# Patient Record
Sex: Female | Born: 1955 | Race: White | Hispanic: Yes | Marital: Married | Smoking: Never smoker
Health system: Southern US, Community
[De-identification: ages and names within clinical notes are randomized; demographics above are authoritative.]

## PROBLEM LIST (undated history)

## (undated) DIAGNOSIS — F419 Anxiety disorder, unspecified: Secondary | ICD-10-CM

## (undated) DIAGNOSIS — E039 Hypothyroidism, unspecified: Secondary | ICD-10-CM

## (undated) DIAGNOSIS — J449 Chronic obstructive pulmonary disease, unspecified: Secondary | ICD-10-CM

## (undated) DIAGNOSIS — K5190 Ulcerative colitis, unspecified, without complications: Secondary | ICD-10-CM

## (undated) DIAGNOSIS — J45909 Unspecified asthma, uncomplicated: Secondary | ICD-10-CM

## (undated) DIAGNOSIS — K219 Gastro-esophageal reflux disease without esophagitis: Secondary | ICD-10-CM

## (undated) DIAGNOSIS — M810 Age-related osteoporosis without current pathological fracture: Secondary | ICD-10-CM

## (undated) DIAGNOSIS — R112 Nausea with vomiting, unspecified: Secondary | ICD-10-CM

## (undated) DIAGNOSIS — M8580 Other specified disorders of bone density and structure, unspecified site: Secondary | ICD-10-CM

## (undated) DIAGNOSIS — Z9889 Other specified postprocedural states: Secondary | ICD-10-CM

## (undated) HISTORY — PX: HERNIA REPAIR: SHX51

## (undated) HISTORY — PX: ESOPHAGOGASTRODUODENOSCOPY: SHX1529

## (undated) HISTORY — PX: COLONOSCOPY: SHX174

---

## 2004-06-09 ENCOUNTER — Ambulatory Visit: Payer: Self-pay | Admitting: Unknown Physician Specialty

## 2004-08-08 ENCOUNTER — Ambulatory Visit: Payer: Self-pay | Admitting: Unknown Physician Specialty

## 2004-08-09 ENCOUNTER — Ambulatory Visit: Payer: Self-pay | Admitting: Internal Medicine

## 2004-10-18 ENCOUNTER — Ambulatory Visit: Payer: Self-pay | Admitting: Internal Medicine

## 2005-09-17 ENCOUNTER — Ambulatory Visit: Payer: Self-pay | Admitting: Internal Medicine

## 2006-01-07 ENCOUNTER — Ambulatory Visit: Payer: Self-pay | Admitting: Unknown Physician Specialty

## 2006-07-09 ENCOUNTER — Ambulatory Visit: Payer: Self-pay | Admitting: Unknown Physician Specialty

## 2006-07-25 ENCOUNTER — Ambulatory Visit: Payer: Self-pay | Admitting: Internal Medicine

## 2006-08-01 ENCOUNTER — Ambulatory Visit: Payer: Self-pay | Admitting: Internal Medicine

## 2006-08-06 ENCOUNTER — Ambulatory Visit: Payer: Self-pay | Admitting: Internal Medicine

## 2006-10-16 ENCOUNTER — Ambulatory Visit: Payer: Self-pay | Admitting: Internal Medicine

## 2007-07-28 ENCOUNTER — Ambulatory Visit: Payer: Self-pay | Admitting: Unknown Physician Specialty

## 2007-10-09 ENCOUNTER — Other Ambulatory Visit: Payer: Self-pay

## 2007-10-09 ENCOUNTER — Emergency Department: Payer: Self-pay | Admitting: Emergency Medicine

## 2007-10-21 ENCOUNTER — Ambulatory Visit: Payer: Self-pay | Admitting: Internal Medicine

## 2007-11-03 ENCOUNTER — Ambulatory Visit: Payer: Self-pay | Admitting: Internal Medicine

## 2007-12-01 ENCOUNTER — Ambulatory Visit: Payer: Self-pay | Admitting: Internal Medicine

## 2007-12-15 ENCOUNTER — Ambulatory Visit: Payer: Self-pay | Admitting: Internal Medicine

## 2008-01-26 ENCOUNTER — Ambulatory Visit: Payer: Self-pay | Admitting: Internal Medicine

## 2008-04-20 ENCOUNTER — Ambulatory Visit: Payer: Self-pay | Admitting: Internal Medicine

## 2008-10-31 ENCOUNTER — Ambulatory Visit: Payer: Self-pay | Admitting: Gastroenterology

## 2008-11-07 ENCOUNTER — Ambulatory Visit: Payer: Self-pay | Admitting: Internal Medicine

## 2008-11-14 ENCOUNTER — Ambulatory Visit: Payer: Self-pay | Admitting: Internal Medicine

## 2008-12-08 ENCOUNTER — Ambulatory Visit: Payer: Self-pay | Admitting: Internal Medicine

## 2009-04-11 ENCOUNTER — Encounter: Payer: Self-pay | Admitting: Internal Medicine

## 2009-05-10 ENCOUNTER — Ambulatory Visit: Payer: Self-pay | Admitting: Gastroenterology

## 2009-07-17 ENCOUNTER — Ambulatory Visit: Payer: Self-pay | Admitting: Gastroenterology

## 2009-12-14 ENCOUNTER — Ambulatory Visit: Payer: Self-pay | Admitting: Internal Medicine

## 2009-12-19 ENCOUNTER — Ambulatory Visit: Payer: Self-pay | Admitting: Internal Medicine

## 2010-09-18 ENCOUNTER — Emergency Department: Payer: Self-pay | Admitting: Emergency Medicine

## 2010-10-10 ENCOUNTER — Other Ambulatory Visit: Payer: Self-pay | Admitting: Physician Assistant

## 2010-10-31 ENCOUNTER — Ambulatory Visit: Payer: Self-pay | Admitting: Gastroenterology

## 2010-12-20 ENCOUNTER — Ambulatory Visit: Payer: Self-pay | Admitting: Internal Medicine

## 2011-01-24 ENCOUNTER — Ambulatory Visit: Payer: Self-pay | Admitting: Internal Medicine

## 2011-03-14 ENCOUNTER — Ambulatory Visit: Payer: Self-pay | Admitting: Internal Medicine

## 2011-04-18 ENCOUNTER — Ambulatory Visit: Payer: Self-pay | Admitting: Internal Medicine

## 2011-05-03 ENCOUNTER — Ambulatory Visit: Payer: Self-pay | Admitting: Rheumatology

## 2011-05-20 ENCOUNTER — Other Ambulatory Visit: Payer: Self-pay | Admitting: *Deleted

## 2011-05-20 MED ORDER — BUDESONIDE-FORMOTEROL FUMARATE 160-4.5 MCG/ACT IN AERO: 2.0000 | INHALATION_SPRAY | Freq: Two times a day (BID) | RESPIRATORY_TRACT | Status: AC

## 2011-05-20 NOTE — Telephone Encounter (Signed)
Received faxed refill request from pharmacy for Symbicort. Is it okay to refill medication?

## 2011-05-21 ENCOUNTER — Other Ambulatory Visit: Payer: Self-pay | Admitting: *Deleted

## 2011-05-21 NOTE — Telephone Encounter (Signed)
Opened in error, refill already requested.

## 2011-07-11 ENCOUNTER — Ambulatory Visit: Payer: Self-pay | Admitting: Pain Medicine

## 2011-07-24 ENCOUNTER — Ambulatory Visit: Payer: Self-pay | Admitting: Pain Medicine

## 2011-08-05 ENCOUNTER — Other Ambulatory Visit: Payer: Self-pay | Admitting: Internal Medicine

## 2011-08-05 MED ORDER — ERYTHROMYCIN 5 MG/GM OP OINT: TOPICAL_OINTMENT | OPHTHALMIC | Status: DC

## 2011-08-05 NOTE — Telephone Encounter (Signed)
Ok to refill erythromycin ointment.

## 2011-08-22 ENCOUNTER — Ambulatory Visit: Payer: Self-pay | Admitting: Pain Medicine

## 2011-09-05 ENCOUNTER — Other Ambulatory Visit: Payer: Self-pay | Admitting: Internal Medicine

## 2011-09-06 NOTE — Telephone Encounter (Signed)
Please advise if ok for RF 

## 2011-09-06 NOTE — Telephone Encounter (Signed)
She has not been seen in this clinicic, and is probably seeing another doc locally since she is Medicaid patient so I cannot refill more than #4 pills (one month)  without having her reestablish care.

## 2011-09-18 ENCOUNTER — Other Ambulatory Visit: Payer: Self-pay | Admitting: *Deleted

## 2011-09-18 MED ORDER — ERYTHROMYCIN 5 MG/GM OP OINT: TOPICAL_OINTMENT | OPHTHALMIC | Status: DC

## 2011-09-18 MED ORDER — LEVOTHYROXINE SODIUM 50 MCG PO TABS: 50.0000 ug | ORAL_TABLET | Freq: Every day | ORAL | Status: AC

## 2011-09-18 MED ORDER — ALBUTEROL 90 MCG/ACT IN AERS: INHALATION_SPRAY | RESPIRATORY_TRACT | Status: AC

## 2011-09-18 NOTE — Telephone Encounter (Signed)
Faxed refill request from [facility], pt has not been seen in this office and doesn't have any appts scheduled.

## 2012-01-21 ENCOUNTER — Ambulatory Visit: Payer: Self-pay | Admitting: Internal Medicine

## 2012-06-01 ENCOUNTER — Telehealth: Payer: Self-pay | Admitting: Internal Medicine

## 2012-06-01 NOTE — Telephone Encounter (Signed)
2nd refill request

## 2012-06-01 NOTE — Telephone Encounter (Signed)
symbicort 160-4.5 mcg INH AER GM Inhale two puffs into lungs twice a day

## 2012-11-17 ENCOUNTER — Ambulatory Visit: Payer: Self-pay | Admitting: Emergency Medicine

## 2013-02-03 ENCOUNTER — Ambulatory Visit: Payer: Self-pay | Admitting: Internal Medicine

## 2013-11-25 ENCOUNTER — Ambulatory Visit: Payer: Self-pay | Admitting: Nurse Practitioner

## 2014-12-29 ENCOUNTER — Ambulatory Visit
Admit: 2014-12-29 | Disposition: A | Discharge status: Home / Self Care | Payer: Self-pay | Attending: Gastroenterology | Admitting: Gastroenterology

## 2014-12-30 NOTE — Op Note (Signed)
PATIENT NAME:  Sherry Schaefer, Sherry M MR#:  [id number] DATE OF BIRTH:  1955/09/16  DATE OF PROCEDURE:  11/17/2012   PREOPERATIVE DIAGNOSES:  1. Incarcerated umbilical hernia.  2. Excision of lipoma of the abdominal wall.   POSTOPERATIVE DIAGNOSES:  1. Incarcerated umbilical hernia.  2. Excision of lipoma of the abdominal wall.   OPERATION:  1. Repair of incarcerated umbilical hernia.  2. Excision of lipoma of the abdominal wall.   DESCRIPTION OF PROCEDURE: This patient was seen by me in the office because of the incarcerated umbilical hernia and also was seen because of the lipoma of the abdominal wall on the left side, and the patient was here to remove that lipoma as well as repair of hernia. She was having discomfort in the hernia area. She is a very thin and slim lady. She also has a history of ulcerative colitis. The umbilical skin was very thinned out. A small incision was made infraumbilically. After cutting the skin and subcutaneous tissue, the hernia sac was dissected all around. The hernia sac was then removed from the umbilical area, and there was a small lipoma stuck into it. It was dissected off, and rent in the abdominal wall was small. The omentum was then tied and pushed back into the abdomen. The wound was then closed with interrupted 0 Vicryl sutures. Two were applied. Subcuticular closing was performed with 3-0 Vicryl from the umbilicus as well as the subcutaneous tissue, and then the skin was closed with 6-0 nylon interrupted stitches.   The patient has a large lipoma of the left abdominal wall. About a 5 cm into 5 cm incision was made on top of it. The lipoma was then dissected off from the intramuscular and intercostal space and was completely excised. The wound was then closed in layers and then with 4-0 nylon interrupted stitches. The patient tolerated the procedure well and was sent to the recovery room in satisfactory condition.    ____________________________ Alton RevereMasud S.  Cecelia ByarsHashmi, MD msh:lo D: 11/17/2012 10:08:06 ET T: 11/17/2012 11:52:29 ET JOB#: [id number]  cc: Masud S. Cecelia ByarsHashmi, MD, <Dictator> Maureen P. Alison MurrayAndreassi, MD  Meryle ReadyMASUD S HASHMI MD ELECTRONICALLY SIGNED 11/19/2012 12:36

## 2015-01-02 LAB — SURGICAL PATHOLOGY

## 2015-01-04 ENCOUNTER — Other Ambulatory Visit: Payer: Self-pay

## 2015-01-04 DIAGNOSIS — Z1231 Encounter for screening mammogram for malignant neoplasm of breast: Secondary | ICD-10-CM

## 2015-01-20 ENCOUNTER — Ambulatory Visit
Admission: RE | Admit: 2015-01-20 | Discharge: 2015-01-20 | Disposition: A | Discharge status: Home / Self Care | Payer: BLUE CROSS/BLUE SHIELD | Source: Ambulatory Visit | Attending: Internal Medicine | Admitting: Internal Medicine

## 2015-01-20 DIAGNOSIS — Z1231 Encounter for screening mammogram for malignant neoplasm of breast: Secondary | ICD-10-CM

## 2015-08-24 ENCOUNTER — Other Ambulatory Visit: Payer: Self-pay | Admitting: Internal Medicine

## 2015-08-24 DIAGNOSIS — R1310 Dysphagia, unspecified: Secondary | ICD-10-CM

## 2015-08-24 DIAGNOSIS — K219 Gastro-esophageal reflux disease without esophagitis: Principal | ICD-10-CM

## 2015-08-24 DIAGNOSIS — IMO0001 Reserved for inherently not codable concepts without codable children: Secondary | ICD-10-CM

## 2015-09-13 ENCOUNTER — Ambulatory Visit: Payer: BLUE CROSS/BLUE SHIELD

## 2015-09-13 ENCOUNTER — Ambulatory Visit
Admission: RE | Admit: 2015-09-13 | Discharge: 2015-09-13 | Disposition: A | Discharge status: Home / Self Care | Payer: BLUE CROSS/BLUE SHIELD | Source: Ambulatory Visit | Attending: Internal Medicine | Admitting: Internal Medicine

## 2015-09-13 DIAGNOSIS — K219 Gastro-esophageal reflux disease without esophagitis: Secondary | ICD-10-CM | POA: Insufficient documentation

## 2015-09-13 DIAGNOSIS — R1310 Dysphagia, unspecified: Secondary | ICD-10-CM

## 2015-09-13 DIAGNOSIS — IMO0001 Reserved for inherently not codable concepts without codable children: Secondary | ICD-10-CM

## 2015-10-16 ENCOUNTER — Encounter: Payer: Self-pay | Admitting: Emergency Medicine

## 2015-10-16 ENCOUNTER — Emergency Department: Payer: BLUE CROSS/BLUE SHIELD

## 2015-10-16 ENCOUNTER — Emergency Department
Admission: EM | Admit: 2015-10-16 | Discharge: 2015-10-16 | Disposition: A | Discharge status: Home / Self Care | Payer: BLUE CROSS/BLUE SHIELD | Attending: Emergency Medicine | Admitting: Emergency Medicine

## 2015-10-16 DIAGNOSIS — Z79899 Other long term (current) drug therapy: Secondary | ICD-10-CM | POA: Diagnosis not present

## 2015-10-16 DIAGNOSIS — F419 Anxiety disorder, unspecified: Secondary | ICD-10-CM | POA: Insufficient documentation

## 2015-10-16 DIAGNOSIS — Z7951 Long term (current) use of inhaled steroids: Secondary | ICD-10-CM | POA: Insufficient documentation

## 2015-10-16 DIAGNOSIS — S199XXA Unspecified injury of neck, initial encounter: Secondary | ICD-10-CM | POA: Diagnosis present

## 2015-10-16 DIAGNOSIS — Y998 Other external cause status: Secondary | ICD-10-CM | POA: Diagnosis not present

## 2015-10-16 DIAGNOSIS — Y9389 Activity, other specified: Secondary | ICD-10-CM | POA: Diagnosis not present

## 2015-10-16 DIAGNOSIS — S134XXA Sprain of ligaments of cervical spine, initial encounter: Secondary | ICD-10-CM | POA: Diagnosis not present

## 2015-10-16 DIAGNOSIS — S20219A Contusion of unspecified front wall of thorax, initial encounter: Secondary | ICD-10-CM

## 2015-10-16 DIAGNOSIS — Y92410 Unspecified street and highway as the place of occurrence of the external cause: Secondary | ICD-10-CM | POA: Insufficient documentation

## 2015-10-16 DIAGNOSIS — Z792 Long term (current) use of antibiotics: Secondary | ICD-10-CM | POA: Insufficient documentation

## 2015-10-16 DIAGNOSIS — S139XXA Sprain of joints and ligaments of unspecified parts of neck, initial encounter: Secondary | ICD-10-CM

## 2015-10-16 HISTORY — DX: Unspecified asthma, uncomplicated: J45.909

## 2015-10-16 LAB — COMPREHENSIVE METABOLIC PANEL
ALK PHOS: 84 U/L (ref 38–126)
ALT: 20 U/L (ref 14–54)
AST: 24 U/L (ref 15–41)
Albumin: 4.3 g/dL (ref 3.5–5.0)
Anion gap: 6 (ref 5–15)
BUN: 8 mg/dL (ref 6–20)
CALCIUM: 9.2 mg/dL (ref 8.9–10.3)
CO2: 28 mmol/L (ref 22–32)
CREATININE: 0.49 mg/dL (ref 0.44–1.00)
Chloride: 105 mmol/L (ref 101–111)
Glucose, Bld: 99 mg/dL (ref 65–99)
Potassium: 3.7 mmol/L (ref 3.5–5.1)
Sodium: 139 mmol/L (ref 135–145)
Total Bilirubin: <0.1 mg/dL — ABNORMAL LOW (ref 0.3–1.2)
Total Protein: 7.8 g/dL (ref 6.5–8.1)

## 2015-10-16 LAB — CBC
HCT: 39.3 % (ref 35.0–47.0)
Hemoglobin: 12.8 g/dL (ref 12.0–16.0)
MCH: 28.6 pg (ref 26.0–34.0)
MCHC: 32.5 g/dL (ref 32.0–36.0)
MCV: 87.9 fL (ref 80.0–100.0)
Platelets: 157 10*3/uL (ref 150–440)
RBC: 4.47 MIL/uL (ref 3.80–5.20)
RDW: 14 % (ref 11.5–14.5)
WBC: 10.7 10*3/uL (ref 3.6–11.0)

## 2015-10-16 LAB — TROPONIN I: Troponin I: <0.03 ng/mL (ref <0.031)

## 2015-10-16 MED ORDER — IOHEXOL 300 MG/ML  SOLN
75.0000 mL | Freq: Once | INTRAMUSCULAR | Status: AC | PRN
  Administered 2015-10-16: 75 mL via INTRAVENOUS
  Filled 2015-10-16: qty 75

## 2015-10-16 MED ORDER — NAPROXEN 500 MG PO TABS: 500.0000 mg | ORAL_TABLET | Freq: Two times a day (BID) | ORAL | Status: DC
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
## 2015-10-16 NOTE — ED Notes (Signed)
c-collar applied  
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
## 2016-01-08 ENCOUNTER — Encounter: Payer: Self-pay | Admitting: *Deleted

## 2016-01-11 ENCOUNTER — Other Ambulatory Visit: Payer: Self-pay | Admitting: Nurse Practitioner

## 2016-01-11 DIAGNOSIS — Z1231 Encounter for screening mammogram for malignant neoplasm of breast: Secondary | ICD-10-CM

## 2016-01-15 NOTE — Discharge Instructions (Signed)

## 2016-01-16 ENCOUNTER — Ambulatory Visit: Payer: BLUE CROSS/BLUE SHIELD | Admitting: Anesthesiology

## 2016-01-16 ENCOUNTER — Ambulatory Visit
Admission: RE | Admit: 2016-01-16 | Discharge: 2016-01-16 | Disposition: A | Discharge status: Home / Self Care | Payer: BLUE CROSS/BLUE SHIELD | Source: Ambulatory Visit | Attending: Gastroenterology | Admitting: Gastroenterology

## 2016-01-16 ENCOUNTER — Encounter
Admission: RE | Disposition: A | Discharge status: Home / Self Care | Payer: Self-pay | Source: Ambulatory Visit | Attending: Gastroenterology

## 2016-01-16 DIAGNOSIS — K922 Gastrointestinal hemorrhage, unspecified: Secondary | ICD-10-CM | POA: Insufficient documentation

## 2016-01-16 DIAGNOSIS — E559 Vitamin D deficiency, unspecified: Secondary | ICD-10-CM | POA: Diagnosis not present

## 2016-01-16 DIAGNOSIS — F419 Anxiety disorder, unspecified: Secondary | ICD-10-CM | POA: Diagnosis not present

## 2016-01-16 DIAGNOSIS — K529 Noninfective gastroenteritis and colitis, unspecified: Secondary | ICD-10-CM | POA: Insufficient documentation

## 2016-01-16 DIAGNOSIS — E039 Hypothyroidism, unspecified: Secondary | ICD-10-CM | POA: Diagnosis not present

## 2016-01-16 DIAGNOSIS — E079 Disorder of thyroid, unspecified: Secondary | ICD-10-CM | POA: Diagnosis not present

## 2016-01-16 DIAGNOSIS — K219 Gastro-esophageal reflux disease without esophagitis: Secondary | ICD-10-CM | POA: Insufficient documentation

## 2016-01-16 DIAGNOSIS — K51919 Ulcerative colitis, unspecified with unspecified complications: Secondary | ICD-10-CM | POA: Diagnosis present

## 2016-01-16 DIAGNOSIS — Z79899 Other long term (current) drug therapy: Secondary | ICD-10-CM | POA: Diagnosis not present

## 2016-01-16 DIAGNOSIS — Z8719 Personal history of other diseases of the digestive system: Secondary | ICD-10-CM | POA: Diagnosis not present

## 2016-01-16 DIAGNOSIS — Z800 Family history of malignant neoplasm of digestive organs: Secondary | ICD-10-CM | POA: Diagnosis not present

## 2016-01-16 DIAGNOSIS — K6289 Other specified diseases of anus and rectum: Secondary | ICD-10-CM | POA: Diagnosis not present

## 2016-01-16 DIAGNOSIS — M810 Age-related osteoporosis without current pathological fracture: Secondary | ICD-10-CM | POA: Insufficient documentation

## 2016-01-16 HISTORY — DX: Ulcerative colitis, unspecified, without complications: K51.90

## 2016-01-16 HISTORY — PX: COLONOSCOPY WITH PROPOFOL: SHX5780

## 2016-01-16 HISTORY — DX: Chronic obstructive pulmonary disease, unspecified: J44.9

## 2016-01-16 HISTORY — DX: Hypothyroidism, unspecified: E03.9

## 2016-01-16 HISTORY — DX: Other specified disorders of bone density and structure, unspecified site: M85.80

## 2016-01-16 HISTORY — DX: Other specified postprocedural states: Z98.890

## 2016-01-16 HISTORY — DX: Anxiety disorder, unspecified: F41.9

## 2016-01-16 HISTORY — DX: Age-related osteoporosis without current pathological fracture: M81.0

## 2016-01-16 HISTORY — DX: Nausea with vomiting, unspecified: R11.2

## 2016-01-16 HISTORY — DX: Gastro-esophageal reflux disease without esophagitis: K21.9

## 2016-01-16 SURGERY — COLONOSCOPY WITH PROPOFOL
Anesthesia: Monitor Anesthesia Care | Wound class: Contaminated

## 2016-01-16 MED ORDER — PROPOFOL 10 MG/ML IV BOLUS
INTRAVENOUS | Status: DC | PRN
  Administered 2016-01-16 (×4): 50 mg via INTRAVENOUS

## 2016-01-16 MED ORDER — STERILE WATER FOR IRRIGATION IR SOLN
Status: DC | PRN
  Administered 2016-01-16: 11:00:00

## 2016-01-16 MED ORDER — ACETAMINOPHEN 325 MG PO TABS: 325.0000 mg | ORAL_TABLET | ORAL | Status: DC | PRN

## 2016-01-16 MED ORDER — ACETAMINOPHEN 160 MG/5ML PO SOLN: 325.0000 mg | ORAL | Status: DC | PRN

## 2016-01-16 MED ORDER — LIDOCAINE HCL (CARDIAC) 20 MG/ML IV SOLN
INTRAVENOUS | Status: DC | PRN
  Administered 2016-01-16: 30 mg via INTRAVENOUS

## 2016-01-16 MED ORDER — ONDANSETRON HCL 4 MG/2ML IJ SOLN: 4.0000 mg | Freq: Once | INTRAMUSCULAR | Status: DC | PRN

## 2016-01-16 MED ORDER — LACTATED RINGERS IV SOLN
INTRAVENOUS | Status: DC
  Administered 2016-01-16: 10:00:00 via INTRAVENOUS

## 2016-01-16 MED ORDER — SODIUM CHLORIDE 0.9 % IV SOLN: INTRAVENOUS | Status: DC

## 2016-01-16 SURGICAL SUPPLY — 30 items
CANISTER SUCT 1200ML W/VALVE (MISCELLANEOUS) ×3 IMPLANT
FCP ESCP3.2XJMB 240X2.8X (MISCELLANEOUS)
FORCEPS BIOP RAD 4 LRG CAP 4 (CUTTING FORCEPS) ×3 IMPLANT
FORCEPS BIOP RJ4 240 W/NDL (MISCELLANEOUS)
FORCEPS ESCP3.2XJMB 240X2.8X (MISCELLANEOUS) IMPLANT
GOWN CVR UNV OPN BCK APRN NK (MISCELLANEOUS) ×1 IMPLANT
GOWN ISOL THUMB LOOP REG UNIV (MISCELLANEOUS) ×2
GOWN STRL REUS W/ TWL LRG LVL3 (GOWN DISPOSABLE) ×1 IMPLANT
GOWN STRL REUS W/TWL LRG LVL3 (GOWN DISPOSABLE) ×2
HEMOCLIP INSTINCT (CLIP) IMPLANT
INJECTOR VARIJECT VIN23 (MISCELLANEOUS) IMPLANT
KIT CO2 TUBING (TUBING) IMPLANT
KIT DEFENDO VALVE AND CONN (KITS) IMPLANT
KIT ENDO PROCEDURE OLY (KITS) ×3 IMPLANT
LIGATOR MULTIBAND 6SHOOTER MBL (MISCELLANEOUS) IMPLANT
MARKER SPOT ENDO TATTOO 5ML (MISCELLANEOUS) IMPLANT
PAD GROUND ADULT SPLIT (MISCELLANEOUS) IMPLANT
SNARE SHORT THROW 13M SML OVAL (MISCELLANEOUS) IMPLANT
SNARE SHORT THROW 30M LRG OVAL (MISCELLANEOUS) IMPLANT
SPOT EX ENDOSCOPIC TATTOO (MISCELLANEOUS)
SUCTION POLY TRAP 4CHAMBER (MISCELLANEOUS) IMPLANT
TRAP SUCTION POLY (MISCELLANEOUS) IMPLANT
TUBING CONN 6MMX3.1M (TUBING)
TUBING SUCTION CONN 0.25 STRL (TUBING) IMPLANT
UNDERPAD 30X60 958B10 (PK) (MISCELLANEOUS) IMPLANT
VALVE BIOPSY ENDO (VALVE) IMPLANT
VARIJECT INJECTOR VIN23 (MISCELLANEOUS)
WATER AUXILLARY (MISCELLANEOUS) IMPLANT
WATER STERILE IRR 250ML POUR (IV SOLUTION) IMPLANT
WATER STERILE IRR 500ML POUR (IV SOLUTION) IMPLANT

## 2016-01-16 NOTE — H&P (Signed)
  Date of Initial H&P: 01/03/2016 History reviewed, patient examined, no change in status, stable for surgery. 

## 2016-01-16 NOTE — Transfer of Care (Signed)
Immediate Anesthesia Transfer of Care Note  Patient: Sherry Schaefer  Procedure(s) Performed: Procedure(s) with comments: COLONOSCOPY WITH PROPOFOL (N/A) - pt requests early  Patient Location: PACU  Anesthesia Type: MAC  Level of Consciousness: awake, alert  and patient cooperative  Airway and Oxygen Therapy: Patient Spontanous Breathing and Patient connected to supplemental oxygen  Post-op Assessment: Post-op Vital signs reviewed, Patient's Cardiovascular Status Stable, Respiratory Function Stable, Patent Airway and No signs of Nausea or vomiting  Post-op Vital Signs: Reviewed and stable  Complications: No apparent anesthesia complications

## 2016-01-16 NOTE — Anesthesia Postprocedure Evaluation (Signed)
Anesthesia Post Note  Patient: [name]  Procedure(s) Performed: Procedure(s) (LRB): COLONOSCOPY WITH PROPOFOL (N/A)  Patient location during evaluation: PACU Anesthesia Type: MAC Level of consciousness: awake and alert and oriented Pain management: pain level controlled Vital Signs Assessment: post-procedure vital signs reviewed and stable Respiratory status: spontaneous breathing and nonlabored ventilation Cardiovascular status: stable Postop Assessment: no signs of nausea or vomiting and adequate PO intake Anesthetic complications: no    Sherry RutherfordJoshua Wynema Schaefer

## 2016-01-16 NOTE — Anesthesia Procedure Notes (Signed)
Procedure Name: MAC Performed by: Kamylah Manzo Pre-anesthesia Checklist: Patient identified, Emergency Drugs available, Suction available, Timeout performed and Patient being monitored Patient Re-evaluated:Patient Re-evaluated prior to inductionOxygen Delivery Method: Nasal cannula Placement Confirmation: positive ETCO2     

## 2016-01-16 NOTE — Anesthesia Preprocedure Evaluation (Addendum)
Anesthesia Evaluation  Patient identified by MRN, date of birth, ID band Patient awake    Reviewed: Allergy & Precautions, NPO status , Patient's Chart, lab work & pertinent test results  History of Anesthesia Complications (+) PONV and history of anesthetic complications  Airway Mallampati: I  TM Distance: >3 FB Neck ROM: Full    Dental no notable dental hx.    Pulmonary asthma , COPD,  COPD inhaler,    Pulmonary exam normal        Cardiovascular Normal cardiovascular exam     Neuro/Psych Anxiety negative neurological ROS     GI/Hepatic Neg liver ROS, GERD  Medicated and Controlled,UC   Endo/Other  Hypothyroidism   Renal/GU negative Renal ROS  negative genitourinary   Musculoskeletal negative musculoskeletal ROS (+)   Abdominal   Peds  Hematology negative hematology ROS (+)   Anesthesia Other Findings   Reproductive/Obstetrics                             Anesthesia Physical Anesthesia Plan  ASA: II  Anesthesia Plan: MAC   Post-op Pain Management:    Induction: Intravenous  Airway Management Planned:   Additional Equipment:   Intra-op Plan:   Post-operative Plan:   Informed Consent: I have reviewed the patients History and Physical, chart, labs and discussed the procedure including the risks, benefits and alternatives for the proposed anesthesia with the patient or authorized representative who has indicated his/her understanding and acceptance.     Plan Discussed with: CRNA  Anesthesia Plan Comments:         Anesthesia Quick Evaluation

## 2016-01-16 NOTE — Progress Notes (Signed)
Except when patient has bleeding needs assistance from mother

## 2016-01-16 NOTE — Op Note (Signed)
[facility] Gastroenterology Patient Name: Sherry Schaefer Procedure Date: 01/16/2016 10:35 AM MRN: [id number] Account #: [id number] Date of Birth: 1956-06-15 Admit Type: Outpatient Age: 60 Room: [facility] OR ROOM 01 Gender: Female Note Status: Finalized Procedure:            Colonoscopy Indications:          Family history of colon cancer in a first-degree                        relative, Personal history of ulcerative colitis Providers:            Ezzard Standing. Bluford Kaufmann, MD Referring MD:         Margaretann Loveless, MD (Referring MD) Medicines:            Monitored Anesthesia Care Complications:        No immediate complications. Procedure:            Pre-Anesthesia Assessment:                       - Prior to the procedure, a History and Physical was                        performed, and patient medications, allergies and                        sensitivities were reviewed. The patient's tolerance of                        previous anesthesia was reviewed.                       - The risks and benefits of the procedure and the                        sedation options and risks were discussed with the                        patient. All questions were answered and informed                        consent was obtained.                       - After reviewing the risks and benefits, the patient                        was deemed in satisfactory condition to undergo the                        procedure.                       After obtaining informed consent, the colonoscope was                        passed under direct vision. Throughout the procedure,                        the patient's blood pressure, pulse, and oxygen  saturations were monitored continuously. The Olympus CF                        H180AL colonoscope (S#: [id number]) was introduced through                        the anus and advanced to the the cecum, identified by                        appendiceal  orifice and ileocecal valve. The                        colonoscopy was performed with difficulty due to a                        tortuous colon. The patient tolerated the procedure                        well. The quality of the bowel preparation was good. Findings:      A diffuse area of moderately altered vascular, erythematous, friable       (with contact bleeding) and inflamed mucosa was found at 25 cm proximal       to the anus. Biopsies were taken with a cold forceps for histology.       Biopsies taken from 25cm and 15cm from anus.      The exam was otherwise without abnormality. Impression:           - Altered vascular, erythematous, friable (with contact                        bleeding) and inflamed mucosa at 25 cm proximal to the                        anus. Biopsied.                       - The examination was otherwise normal. Recommendation:       - Discharge patient to home.                       - Continue present medications.                       - Await pathology results.                       - The findings and recommendations were discussed with                        the patient's family. Procedure Code(s):    --- Professional ---                       [id number], Colonoscopy, flexible; with biopsy, single or                        multiple Diagnosis Code(s):    --- Professional ---                       K63.89, Other specified diseases of intestine  K92.2, Gastrointestinal hemorrhage, unspecified                       K52.9, Noninfective gastroenteritis and colitis,                        unspecified                       Z80.0, Family history of malignant neoplasm of                        digestive organs                       Z87.19, Personal history of other diseases of the                        digestive system CPT copyright 2016 [employer]. All rights reserved. The codes documented in this report are preliminary and upon  coder review may  be revised to meet current compliance requirements. Wallace CullensPaul Y Maxton Noreen, MD 01/16/2016 10:56:59 AM This report has been signed electronically. Number of Addenda: 0 Note Initiated On: 01/16/2016 10:35 AM Scope Withdrawal Time: 0 hours 5 minutes 43 seconds  Total Procedure Duration: 0 hours 11 minutes 59 seconds       [facility]

## 2016-01-17 ENCOUNTER — Encounter: Payer: Self-pay | Admitting: Gastroenterology

## 2016-01-18 LAB — SURGICAL PATHOLOGY

## 2016-01-22 ENCOUNTER — Ambulatory Visit: Payer: BLUE CROSS/BLUE SHIELD

## 2016-01-24 ENCOUNTER — Ambulatory Visit
Admission: RE | Admit: 2016-01-24 | Discharge: 2016-01-24 | Disposition: A | Discharge status: Home / Self Care | Payer: BLUE CROSS/BLUE SHIELD | Source: Ambulatory Visit | Attending: Nurse Practitioner | Admitting: Nurse Practitioner

## 2016-01-24 DIAGNOSIS — Z1231 Encounter for screening mammogram for malignant neoplasm of breast: Secondary | ICD-10-CM | POA: Insufficient documentation

## 2016-03-05 ENCOUNTER — Other Ambulatory Visit: Payer: Self-pay | Admitting: Internal Medicine

## 2016-03-05 DIAGNOSIS — M25561 Pain in right knee: Principal | ICD-10-CM

## 2016-03-05 DIAGNOSIS — G8929 Other chronic pain: Secondary | ICD-10-CM

## 2016-04-05 ENCOUNTER — Ambulatory Visit
Admission: RE | Admit: 2016-04-05 | Discharge: 2016-04-05 | Disposition: A | Discharge status: Home / Self Care | Payer: BLUE CROSS/BLUE SHIELD | Source: Ambulatory Visit | Attending: Internal Medicine | Admitting: Internal Medicine

## 2016-04-05 DIAGNOSIS — M25461 Effusion, right knee: Secondary | ICD-10-CM | POA: Insufficient documentation

## 2016-04-05 DIAGNOSIS — M25561 Pain in right knee: Secondary | ICD-10-CM | POA: Diagnosis present

## 2016-04-05 DIAGNOSIS — G8929 Other chronic pain: Secondary | ICD-10-CM | POA: Diagnosis present
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
## 2017-02-27 ENCOUNTER — Other Ambulatory Visit: Payer: Self-pay | Admitting: Nurse Practitioner

## 2017-02-27 ENCOUNTER — Other Ambulatory Visit: Payer: Self-pay | Admitting: Internal Medicine

## 2017-02-27 DIAGNOSIS — Z1231 Encounter for screening mammogram for malignant neoplasm of breast: Secondary | ICD-10-CM

## 2017-06-06 ENCOUNTER — Other Ambulatory Visit
Admission: RE | Admit: 2017-06-06 | Discharge: 2017-06-06 | Disposition: A | Discharge status: Home / Self Care | Payer: BLUE CROSS/BLUE SHIELD | Source: Ambulatory Visit | Attending: Gastroenterology | Admitting: Gastroenterology

## 2017-06-06 DIAGNOSIS — R197 Diarrhea, unspecified: Secondary | ICD-10-CM | POA: Diagnosis present

## 2017-06-06 DIAGNOSIS — K51911 Ulcerative colitis, unspecified with rectal bleeding: Secondary | ICD-10-CM | POA: Insufficient documentation

## 2017-06-06 LAB — GASTROINTESTINAL PANEL BY PCR, STOOL (REPLACES STOOL CULTURE)
Adenovirus F40/41: NOT DETECTED
Astrovirus: NOT DETECTED
CAMPYLOBACTER SPECIES: NOT DETECTED
CRYPTOSPORIDIUM: NOT DETECTED
CYCLOSPORA CAYETANENSIS: NOT DETECTED
Entamoeba histolytica: NOT DETECTED
Enteroaggregative E coli (EAEC): NOT DETECTED
Enteropathogenic E coli (EPEC): NOT DETECTED
Enterotoxigenic E coli (ETEC): NOT DETECTED
GIARDIA LAMBLIA: NOT DETECTED
Norovirus GI/GII: NOT DETECTED
PLESIMONAS SHIGELLOIDES: NOT DETECTED
ROTAVIRUS A: NOT DETECTED
SHIGELLA/ENTEROINVASIVE E COLI (EIEC): NOT DETECTED
Salmonella species: NOT DETECTED
Sapovirus (I, II, IV, and V): NOT DETECTED
Shiga like toxin producing E coli (STEC): NOT DETECTED
Vibrio cholerae: NOT DETECTED
Vibrio species: NOT DETECTED
YERSINIA ENTEROCOLITICA: NOT DETECTED

## 2017-06-06 LAB — C DIFFICILE QUICK SCREEN W PCR REFLEX
C DIFFICILE (CDIFF) INTERP: NOT DETECTED
C Diff antigen: NEGATIVE
C Diff toxin: NEGATIVE

## 2017-06-20 ENCOUNTER — Encounter: Payer: Self-pay | Admitting: Emergency Medicine

## 2017-06-20 ENCOUNTER — Emergency Department: Payer: BLUE CROSS/BLUE SHIELD

## 2017-06-20 ENCOUNTER — Emergency Department
Admission: EM | Admit: 2017-06-20 | Discharge: 2017-06-20 | Disposition: A | Discharge status: Home / Self Care | Payer: BLUE CROSS/BLUE SHIELD | Attending: Emergency Medicine | Admitting: Emergency Medicine

## 2017-06-20 DIAGNOSIS — Z79899 Other long term (current) drug therapy: Secondary | ICD-10-CM | POA: Insufficient documentation

## 2017-06-20 DIAGNOSIS — Y9389 Activity, other specified: Secondary | ICD-10-CM | POA: Insufficient documentation

## 2017-06-20 DIAGNOSIS — J449 Chronic obstructive pulmonary disease, unspecified: Secondary | ICD-10-CM | POA: Insufficient documentation

## 2017-06-20 DIAGNOSIS — J45909 Unspecified asthma, uncomplicated: Secondary | ICD-10-CM | POA: Diagnosis not present

## 2017-06-20 DIAGNOSIS — E039 Hypothyroidism, unspecified: Secondary | ICD-10-CM | POA: Diagnosis not present

## 2017-06-20 DIAGNOSIS — Y929 Unspecified place or not applicable: Secondary | ICD-10-CM | POA: Insufficient documentation

## 2017-06-20 DIAGNOSIS — S3992XA Unspecified injury of lower back, initial encounter: Secondary | ICD-10-CM | POA: Diagnosis present

## 2017-06-20 DIAGNOSIS — S32010A Wedge compression fracture of first lumbar vertebra, initial encounter for closed fracture: Secondary | ICD-10-CM | POA: Diagnosis not present

## 2017-06-20 DIAGNOSIS — X500XXA Overexertion from strenuous movement or load, initial encounter: Secondary | ICD-10-CM | POA: Diagnosis not present

## 2017-06-20 DIAGNOSIS — Y999 Unspecified external cause status: Secondary | ICD-10-CM | POA: Insufficient documentation

## 2017-06-20 MED ORDER — OXYCODONE-ACETAMINOPHEN 5-325 MG PO TABS
1.0000 | ORAL_TABLET | Freq: Once | ORAL | Status: DC
Start: 2017-06-20 — End: 2017-06-20
  Filled 2017-06-20: qty 1

## 2017-06-20 MED ORDER — OXYCODONE-ACETAMINOPHEN 5-325 MG PO TABS
0.5000 | ORAL_TABLET | Freq: Once | ORAL | Status: AC
  Administered 2017-06-20: 0.5 via ORAL

## 2017-06-20 MED ORDER — OXYCODONE-ACETAMINOPHEN 5-325 MG PO TABS
0.5000 | ORAL_TABLET | Freq: Once | ORAL | Status: AC
  Administered 2017-06-20: 0.5 via ORAL
  Filled 2017-06-20: qty 1

## 2017-06-20 MED ORDER — OXYCODONE-ACETAMINOPHEN 5-325 MG PO TABS: ORAL_TABLET | ORAL | 0 refills | Status: AC

## 2017-06-20 NOTE — ED Notes (Signed)
Transported to XR  

## 2017-06-20 NOTE — ED Provider Notes (Signed)
[facility] Emergency Department Provider Note  ____________________________________________  Time seen: Approximately 10:27 AM  I have reviewed the triage vital signs and the nursing notes.   HISTORY  Chief Complaint Back Pain    HPI Sherry Schaefer is a 61 y.o. female that presents to the emergency department for evaluation of low back pain. Patient was trying to lift a bucket of water when she heard a pop in her low back. Pain is in the center of her back and does not radiate. This has never happened before. No nausea, vomiting, abdominal pain, bowel or bladder dysfunction, numbness, tingling.   Past Medical History:  Diagnosis Date  . Anxiety   . Asthma   . COPD (chronic obstructive pulmonary disease) (HCC)   . GERD (gastroesophageal reflux disease)   . Hypothyroidism   . Osteopenia   . Osteoporosis   . PONV (postoperative nausea and vomiting)    if demerol or versed are used  . Ulcerative colitis (HCC)     There are no active problems to display for this patient.   Past Surgical History:  Procedure Laterality Date  . CESAREAN SECTION    . COLONOSCOPY    . COLONOSCOPY WITH PROPOFOL N/A 01/16/2016   Procedure: COLONOSCOPY WITH PROPOFOL;  Surgeon: Wallace Cullens, MD;  Location: [facility];  Service: Gastroenterology;  Laterality: N/A;  pt requests early  . ESOPHAGOGASTRODUODENOSCOPY    . HERNIA REPAIR      Prior to Admission medications   Medication Sig Start Date End Date Taking? Authorizing Provider  albuterol (PROVENTIL,VENTOLIN) 90 MCG/ACT inhaler Use one puff every 6 hours as needed for cough. 09/18/11   Sherlene Shams, MD  Alum Hydroxide-Mag Carbonate (GAVISCON EXTRA STRENGTH PO) Take by mouth daily as needed.    [provider]  budesonide-formoterol (SYMBICORT) 160-4.5 MCG/ACT inhaler Inhale 2 puffs into the lungs 2 (two) times daily. 05/20/11   Sherlene Shams, MD  Calcium Carbonate (CALCIUM 600 PO) Take by mouth 2  (two) times daily.    [provider]  clonazePAM (KLONOPIN) 1 MG tablet Take 1 mg by mouth 3 (three) times daily as needed for anxiety.    [provider]  Ergocalciferol (VITAMIN D2) 2000 units TABS Take by mouth daily.    [provider]  levothyroxine (SYNTHROID, LEVOTHROID) 50 MCG tablet Take 50 mcg by mouth daily before breakfast.    [provider]  Magnesium 250 MG TABS Take by mouth daily.    [provider]  Mesalamine (ASACOL HD) 800 MG TBEC Take 2,400 mg by mouth 2 (two) times daily. Reported on 01/16/2016    [provider]  Multiple Vitamin (MULTIVITAMIN) capsule Take 1 capsule by mouth daily.    [provider]  omeprazole (PRILOSEC) 40 MG capsule Take 40 mg by mouth daily.    [provider]  oxyCODONE-acetaminophen (ROXICET) 5-325 MG tablet Take 1/2-1 tablet every 6 hours for pain 06/20/17   Enid Derry, PA-C    Allergies Cortenema [hydrocortisone]; Demerol [meperidine]; Lexapro [escitalopram oxalate]; Spiriva handihaler [tiotropium bromide monohydrate]; and Versed [midazolam]  History reviewed. No pertinent family history.  Social History Social History  Substance Use Topics  . Smoking status: Never Smoker  . Smokeless tobacco: Never Used  . Alcohol use No     Review of Systems  Constitutional: No fever/chills Cardiovascular: No chest pain. Respiratory: No SOB. Gastrointestinal: No abdominal pain.  No nausea, no vomiting.  Musculoskeletal: Positive for back pain. Skin: Negative for  rash, abrasions, lacerations, ecchymosis. Neurological: Negative for headaches, numbness or tingling   ____________________________________________   PHYSICAL EXAM:  VITAL SIGNS: ED Triage Vitals  Enc Vitals Group     BP 06/20/17 0934 130/79     Pulse Rate 06/20/17 0934 96     Resp 06/20/17 0934 18     Temp 06/20/17 0934 98.1 F (36.7 C)     Temp Source 06/20/17 0934 Oral     SpO2 06/20/17 0934 100 %      Weight 06/20/17 0937 81 lb (36.7 kg)     Height 06/20/17 0937 5' (1.524 m)     Head Circumference --      Peak Flow --      Pain Score 06/20/17 0934 10     Pain Loc --      Pain Edu? --      Excl. in GC? --      Constitutional: Alert and oriented. Well appearing and in no acute distress. Eyes: Conjunctivae are normal. PERRL. EOMI. Head: Atraumatic. ENT:      Ears:      Nose: No congestion/rhinnorhea.      Mouth/Throat: Mucous membranes are moist.  Neck: No stridor.  Cardiovascular: Normal rate, regular rhythm.  Good peripheral circulation. Metro dorsalis pedis pulses bilaterally. Respiratory: Normal respiratory effort without tachypnea or retractions. Lungs CTAB. Good air entry to the bases with no decreased or absent breath sounds. Gastrointestinal: Bowel sounds 4 quadrants. Soft and nontender to palpation. No guarding or rigidity. No palpable masses. No distention.  Musculoskeletal: Full range of motion to all extremities. No gross deformities appreciated. Tenderness to palpation over lumbar spine. Strength 5 out of 5 in lower extremities bilaterally. Negative straight leg and cross leg raise. Neurologic:  Normal speech and language. No gross focal neurologic deficits are appreciated.  Skin:  Skin is warm, dry and intact. No rash noted.   ____________________________________________   LABS (all labs ordered are listed, but only abnormal results are displayed)  Labs Reviewed - No data to display ____________________________________________  EKG   ____________________________________________  RADIOLOGY Lexine Baton, personally viewed and evaluated these images (plain radiographs) as part of my medical decision making, as well as reviewing the written report by the radiologist.   Dg Lumbar Spine Complete  Result Date: 06/20/2017 CLINICAL DATA:  Severe back pain after lifting a but that of water. EXAM: LUMBAR SPINE - COMPLETE 4+ VIEW COMPARISON:  Lumbar spine  MRI dated December 19, 2009. FINDINGS: There are 5 lumbar type vertebral bodies. Questionable cortical irregularity along the anterior superior endplate of L1. Vertebral body heights are preserved. Sagittal alignment is maintained. Intervertebral disc spaces are preserved. Mild lower lumbar facet arthropathy. Osteopenia. IMPRESSION: Questionable cortical irregularity along the anterior superior plate of L1, which could reflect minimal compression deformity. Correlate with point tenderness and consider CT or MRI for further evaluation. Electronically Signed   By: Obie Dredge M.D.   On: 06/20/2017 10:57   Ct Lumbar Spine Wo Contrast  Result Date: 06/20/2017 CLINICAL DATA:  Pt arrived via EMS from home with reports of back pain. Pt states she picked up a bucket of water in the dark and felt a pop in her lower back. Pt has hx of osteoporosis. Pt reports pain with any movement and is unable to sit up. EXAM: CT LUMBAR SPINE WITHOUT CONTRAST TECHNIQUE: Multidetector CT imaging of the lumbar spine was performed without intravenous contrast administration. Multiplanar CT image reconstructions were also generated. COMPARISON:  None. FINDINGS: Segmentation: 5  lumbar type vertebrae. Alignment: Normal. Vertebrae: No aggressive osseous lesion. Mild L1 vertebral body compression fracture with approximately 10% height loss. Paraspinal and other soft tissues: No paraspinal abnormality. Disc levels: Disc spaces are maintained. No foraminal or central canal stenosis. IMPRESSION: 1. Mild acute L1 vertebral body compression fracture with approximately 10% height loss. Electronically Signed   By: Elige Ko   On: 06/20/2017 12:29    ____________________________________________    PROCEDURES  Procedure(s) performed:    Procedures    Medications  oxyCODONE-acetaminophen (PERCOCET/ROXICET) 5-325 MG per tablet 0.5 tablet (0.5 tablets Oral Given 06/20/17 1000)  oxyCODONE-acetaminophen (PERCOCET/ROXICET) 5-325 MG per  tablet 0.5 tablet (0.5 tablets Oral Given 06/20/17 1127)  oxyCODONE-acetaminophen (PERCOCET/ROXICET) 5-325 MG per tablet 0.5 tablet (0.5 tablets Oral Given 06/20/17 1327)     ____________________________________________   INITIAL IMPRESSION / ASSESSMENT AND PLAN / ED COURSE  Pertinent labs & imaging results that were available during my care of the patient were reviewed by me and considered in my medical decision making (see chart for details).  Review of the [place] CSRS was performed in accordance of the [facility] prior to dispensing any controlled drugs.   Patient's diagnosis is consistent with mild compression fracture of L1. Vital signs and exam are reassuring. Patient is not having any radicular symptoms. Options for pain control was discussed with Dr. Darnelle Catalan and he agrees with plan of care. Patient will be discharged home with prescriptions for percocet. Patient is to follow up with PCP as directed. Patient is given ED precautions to return to the ED for any worsening or new symptoms.   ____________________________________________  FINAL CLINICAL IMPRESSION(S) / ED DIAGNOSES  Final diagnoses:  Closed compression fracture of first lumbar vertebra, initial encounter [facility])      NEW MEDICATIONS STARTED DURING THIS VISIT:  Discharge Medication List as of 06/20/2017  2:05 PM    START taking these medications   Details  oxyCODONE-acetaminophen (ROXICET) 5-325 MG tablet Take 1/2-1 tablet every 6 hours for pain, Print            This chart was dictated using voice recognition software/Dragon. Despite best efforts to proofread, errors can occur which can change the meaning. Any change was purely unintentional.    Enid Derry, PA-C 06/20/17 1538    Arnaldo Natal, MD 06/20/17 [phone]

## 2017-06-20 NOTE — ED Notes (Signed)
Returned from XR 

## 2017-06-20 NOTE — ED Triage Notes (Signed)
Pt arrived via EMS from home with reports of back pain. Pt states she picked up a bucket of water in the dark and felt a pop in her lower back. Pt has hx of osteoporosis.  Pt reports pain with any movement and is unable to sit up.

## 2018-02-24 ENCOUNTER — Other Ambulatory Visit: Payer: Self-pay | Admitting: Internal Medicine

## 2019-04-02 ENCOUNTER — Other Ambulatory Visit: Payer: Self-pay | Admitting: Physician Assistant

## 2019-04-02 DIAGNOSIS — Z1231 Encounter for screening mammogram for malignant neoplasm of breast: Secondary | ICD-10-CM

## 2021-02-07 ENCOUNTER — Other Ambulatory Visit (HOSPITAL_COMMUNITY): Payer: Self-pay | Admitting: Physician Assistant

## 2021-02-07 ENCOUNTER — Other Ambulatory Visit: Payer: Self-pay | Admitting: Physician Assistant

## 2021-02-07 DIAGNOSIS — R1011 Right upper quadrant pain: Secondary | ICD-10-CM

## 2021-02-08 ENCOUNTER — Ambulatory Visit
Admission: RE | Admit: 2021-02-08 | Discharge: 2021-02-08 | Disposition: A | Discharge status: Home / Self Care | Payer: PRIVATE HEALTH INSURANCE | Source: Ambulatory Visit | Attending: Physician Assistant | Admitting: Physician Assistant

## 2021-02-08 DIAGNOSIS — R1011 Right upper quadrant pain: Secondary | ICD-10-CM | POA: Diagnosis not present

## 2021-05-10 ENCOUNTER — Other Ambulatory Visit: Payer: Self-pay | Admitting: Physician Assistant

## 2021-05-10 DIAGNOSIS — Z1231 Encounter for screening mammogram for malignant neoplasm of breast: Secondary | ICD-10-CM

## 2021-05-30 ENCOUNTER — Other Ambulatory Visit: Payer: Self-pay

## 2021-05-30 ENCOUNTER — Ambulatory Visit
Admission: RE | Admit: 2021-05-30 | Discharge: 2021-05-30 | Disposition: A | Discharge status: Home / Self Care | Payer: PRIVATE HEALTH INSURANCE | Source: Ambulatory Visit | Attending: Physician Assistant | Admitting: Physician Assistant

## 2021-05-30 DIAGNOSIS — Z1231 Encounter for screening mammogram for malignant neoplasm of breast: Secondary | ICD-10-CM

## 2021-06-01 ENCOUNTER — Other Ambulatory Visit: Payer: Self-pay | Admitting: *Deleted

## 2021-06-01 ENCOUNTER — Inpatient Hospital Stay
Admission: RE | Admit: 2021-06-01 | Discharge: 2021-06-01 | Disposition: A | Discharge status: Home / Self Care | Payer: Self-pay | Source: Ambulatory Visit | Attending: *Deleted | Admitting: *Deleted

## 2021-06-01 DIAGNOSIS — Z1231 Encounter for screening mammogram for malignant neoplasm of breast: Secondary | ICD-10-CM

## 2022-03-21 ENCOUNTER — Other Ambulatory Visit: Payer: Self-pay | Admitting: Physician Assistant

## 2022-05-08 ENCOUNTER — Other Ambulatory Visit: Payer: Self-pay | Admitting: Physician Assistant

## 2022-05-08 DIAGNOSIS — Z1231 Encounter for screening mammogram for malignant neoplasm of breast: Secondary | ICD-10-CM

## 2022-05-15 ENCOUNTER — Other Ambulatory Visit: Payer: Self-pay | Admitting: Physician Assistant

## 2022-05-15 DIAGNOSIS — M810 Age-related osteoporosis without current pathological fracture: Secondary | ICD-10-CM

## 2022-05-16 ENCOUNTER — Other Ambulatory Visit: Payer: Self-pay | Admitting: Physician Assistant

## 2022-05-16 DIAGNOSIS — Z1231 Encounter for screening mammogram for malignant neoplasm of breast: Secondary | ICD-10-CM

## 2022-06-26 ENCOUNTER — Ambulatory Visit
Admission: RE | Admit: 2022-06-26 | Discharge: 2022-06-26 | Disposition: A | Discharge status: Home / Self Care | Payer: PRIVATE HEALTH INSURANCE | Source: Ambulatory Visit | Attending: Physician Assistant | Admitting: Physician Assistant

## 2022-06-26 DIAGNOSIS — Z1231 Encounter for screening mammogram for malignant neoplasm of breast: Secondary | ICD-10-CM | POA: Insufficient documentation

## 2022-06-26 DIAGNOSIS — M810 Age-related osteoporosis without current pathological fracture: Secondary | ICD-10-CM | POA: Insufficient documentation

## 2023-06-03 ENCOUNTER — Other Ambulatory Visit: Payer: Self-pay | Admitting: Physician Assistant

## 2023-06-03 DIAGNOSIS — Z1231 Encounter for screening mammogram for malignant neoplasm of breast: Secondary | ICD-10-CM

## 2023-07-02 ENCOUNTER — Ambulatory Visit
Admission: RE | Admit: 2023-07-02 | Discharge: 2023-07-02 | Disposition: A | Discharge status: Home / Self Care | Payer: Commercial Managed Care - PPO | Source: Ambulatory Visit | Attending: Physician Assistant | Admitting: Physician Assistant

## 2023-07-02 DIAGNOSIS — Z1231 Encounter for screening mammogram for malignant neoplasm of breast: Secondary | ICD-10-CM | POA: Insufficient documentation
